# Patient Record
Sex: Male | Born: 1980 | Race: White | Hispanic: No | Marital: Single | Smoking: Current every day smoker
Health system: Southern US, Community
[De-identification: ages and names within clinical notes are randomized; demographics above are authoritative.]

## PROBLEM LIST (undated history)

## (undated) DIAGNOSIS — F32A Depression, unspecified: Secondary | ICD-10-CM

## (undated) DIAGNOSIS — F419 Anxiety disorder, unspecified: Secondary | ICD-10-CM

## (undated) DIAGNOSIS — T7840XA Allergy, unspecified, initial encounter: Secondary | ICD-10-CM

## (undated) DIAGNOSIS — F329 Major depressive disorder, single episode, unspecified: Secondary | ICD-10-CM

## (undated) HISTORY — DX: Depression, unspecified: F32.A

## (undated) HISTORY — DX: Anxiety disorder, unspecified: F41.9

## (undated) HISTORY — DX: Allergy, unspecified, initial encounter: T78.40XA

## (undated) HISTORY — DX: Major depressive disorder, single episode, unspecified: F32.9

---

## 2001-08-10 ENCOUNTER — Encounter: Admission: RE | Admit: 2001-08-10 | Discharge: 2001-08-10 | Payer: Self-pay | Admitting: Family Medicine

## 2001-10-16 ENCOUNTER — Emergency Department (HOSPITAL_COMMUNITY): Admission: EM | Admit: 2001-10-16 | Discharge: 2001-10-17 | Payer: Self-pay | Admitting: Emergency Medicine

## 2001-10-19 ENCOUNTER — Encounter: Admission: RE | Admit: 2001-10-19 | Discharge: 2001-10-19 | Payer: Self-pay | Admitting: Family Medicine

## 2001-10-26 ENCOUNTER — Encounter: Admission: RE | Admit: 2001-10-26 | Discharge: 2001-10-26 | Payer: Self-pay | Admitting: Family Medicine

## 2001-10-26 ENCOUNTER — Encounter: Admission: RE | Admit: 2001-10-26 | Discharge: 2001-10-26 | Payer: Self-pay | Admitting: Pediatrics

## 2001-10-28 ENCOUNTER — Encounter: Admission: RE | Admit: 2001-10-28 | Discharge: 2001-10-28 | Payer: Self-pay | Admitting: Family Medicine

## 2001-11-03 ENCOUNTER — Encounter: Admission: RE | Admit: 2001-11-03 | Discharge: 2001-11-03 | Payer: Self-pay | Admitting: Family Medicine

## 2002-02-28 ENCOUNTER — Encounter: Admission: RE | Admit: 2002-02-28 | Discharge: 2002-02-28 | Payer: Self-pay | Admitting: Family Medicine

## 2006-03-12 ENCOUNTER — Ambulatory Visit: Payer: Self-pay | Admitting: Family Medicine

## 2008-09-22 ENCOUNTER — Telehealth: Payer: Self-pay | Admitting: *Deleted

## 2008-09-22 ENCOUNTER — Ambulatory Visit: Payer: Self-pay | Admitting: Family Medicine

## 2008-09-22 DIAGNOSIS — M545 Low back pain: Secondary | ICD-10-CM

## 2009-09-22 ENCOUNTER — Encounter (INDEPENDENT_AMBULATORY_CARE_PROVIDER_SITE_OTHER): Payer: Self-pay | Admitting: *Deleted

## 2009-09-22 DIAGNOSIS — F17200 Nicotine dependence, unspecified, uncomplicated: Secondary | ICD-10-CM

## 2009-10-22 ENCOUNTER — Emergency Department (HOSPITAL_COMMUNITY): Admission: EM | Admit: 2009-10-22 | Discharge: 2009-10-22 | Payer: Self-pay | Admitting: Emergency Medicine

## 2010-10-12 ENCOUNTER — Emergency Department (HOSPITAL_COMMUNITY): Admission: EM | Admit: 2010-10-12 | Discharge: 2010-10-12 | Payer: Self-pay | Admitting: Emergency Medicine

## 2011-01-05 ENCOUNTER — Encounter: Payer: Self-pay | Admitting: Family Medicine

## 2011-02-02 ENCOUNTER — Encounter: Payer: Self-pay | Admitting: *Deleted

## 2011-02-26 LAB — URINALYSIS, ROUTINE W REFLEX MICROSCOPIC
Bilirubin Urine: NEGATIVE
Ketones, ur: NEGATIVE mg/dL
Nitrite: NEGATIVE
Protein, ur: NEGATIVE mg/dL
Specific Gravity, Urine: 1.024 (ref 1.005–1.030)
Urobilinogen, UA: 0.2 mg/dL (ref 0.0–1.0)

## 2011-02-26 LAB — POCT I-STAT, CHEM 8
BUN: 14 mg/dL (ref 6–23)
Chloride: 104 mEq/L (ref 96–112)
HCT: 46 % (ref 39.0–52.0)
Sodium: 139 mEq/L (ref 135–145)

## 2011-02-26 LAB — RAPID URINE DRUG SCREEN, HOSP PERFORMED
Amphetamines: NOT DETECTED
Tetrahydrocannabinol: NOT DETECTED

## 2011-04-06 IMAGING — CT CT HEAD W/O CM
1 of 2 series · 16 of 30 positions shown, 20 images · non-contrast
Comparison: None.

CLINICAL DATA: Seizure, passed out.

CT HEAD WITHOUT CONTRAST
TECHNIQUE: Contiguous axial images were obtained from the base of
the skull through the vertex without contrast.

[Series 3: recon 2: brain · axial · 0.49mm/px · z∈[+129,+269]mm · 16 of 64 slices shown, 20 images]
[im 4/64  brain]
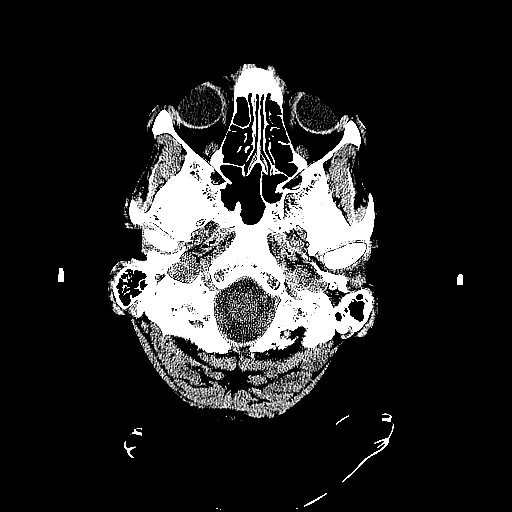
[im 4/64  bone]
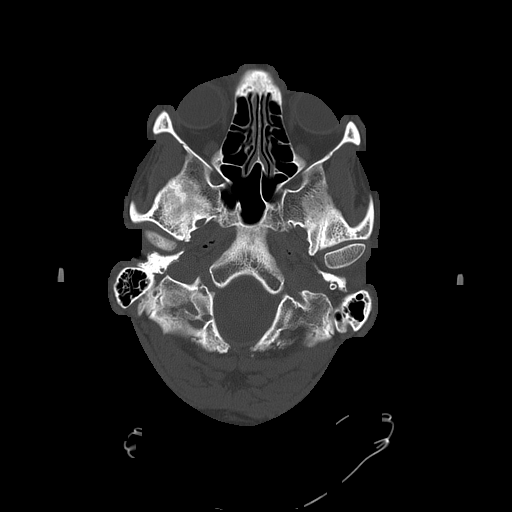
[im 7/64  brain]
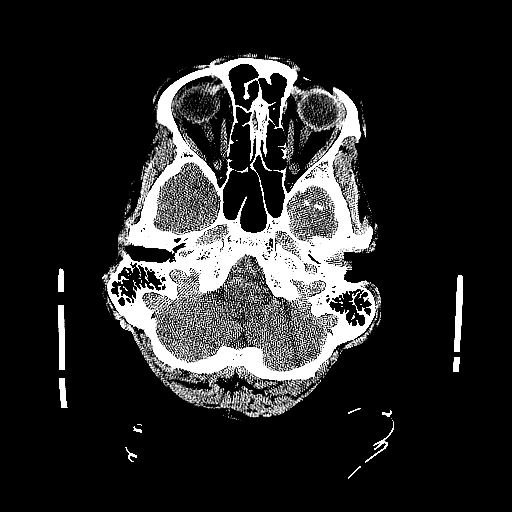
[im 10/64  brain]
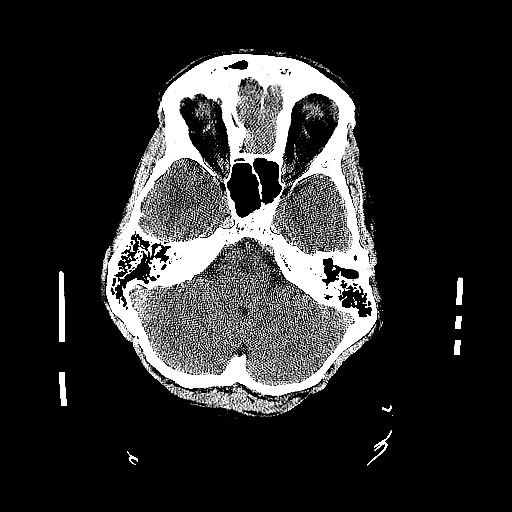
[im 14/64  brain]
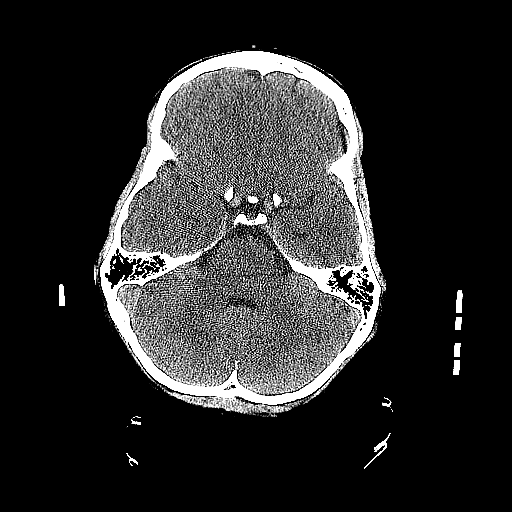
[im 20/64  brain]
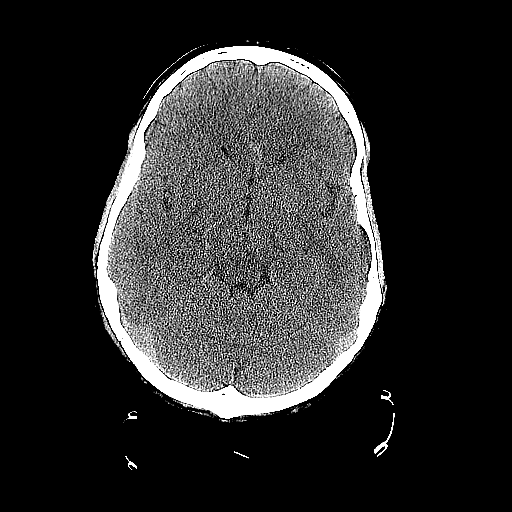
[im 20/64  bone]
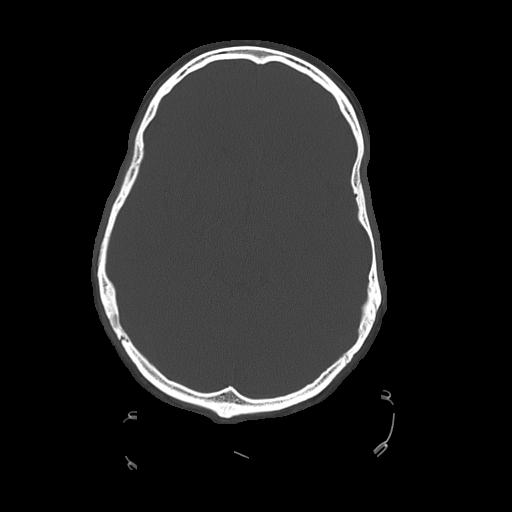
[im 24/64  brain]
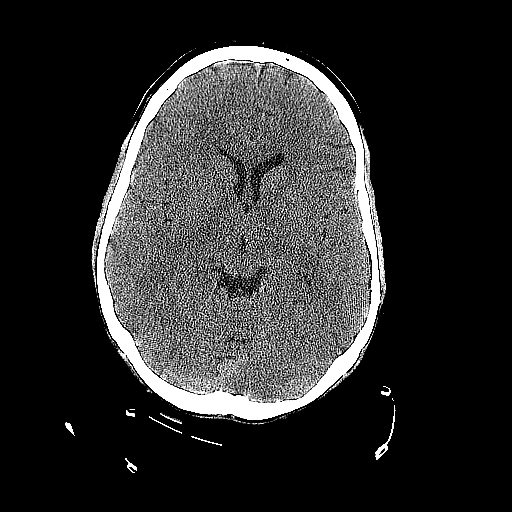
[im 27/64  brain]
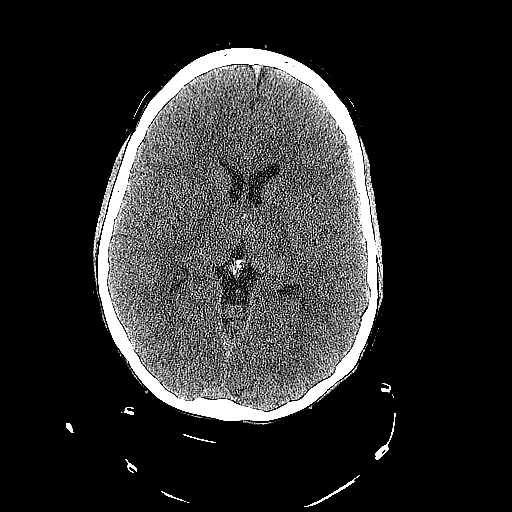
[im 30/64  brain]
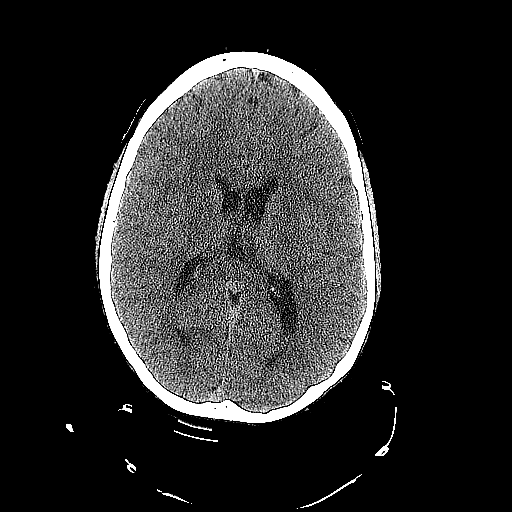
[im 34/64  brain]
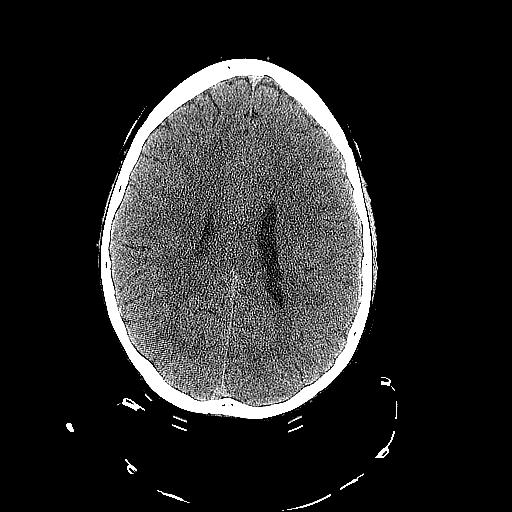
[im 34/64  bone]
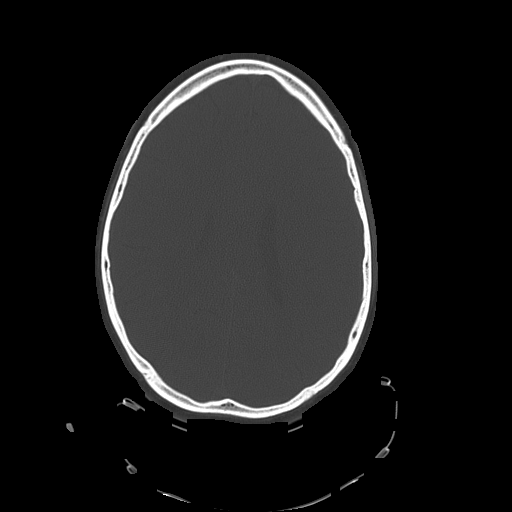
[im 37/64  brain]
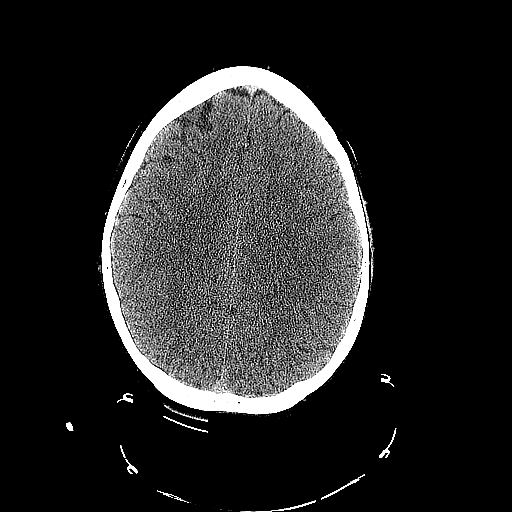
[im 40/64  brain]
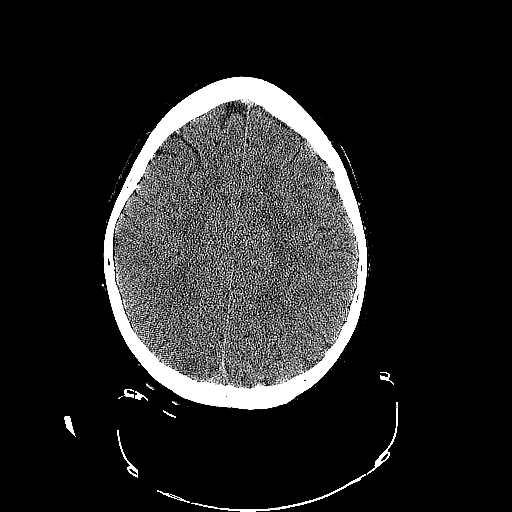
[im 44/64  brain]
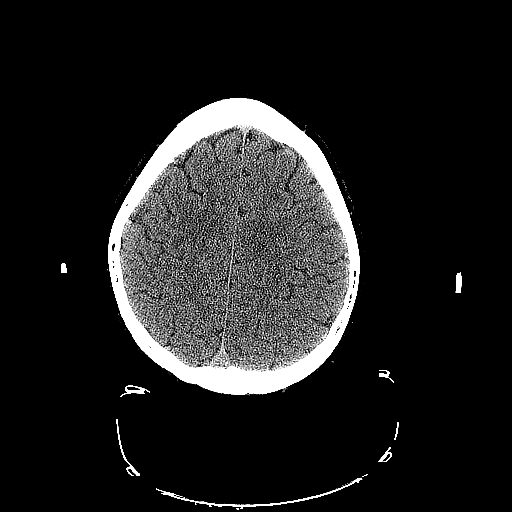
[im 50/64  brain]
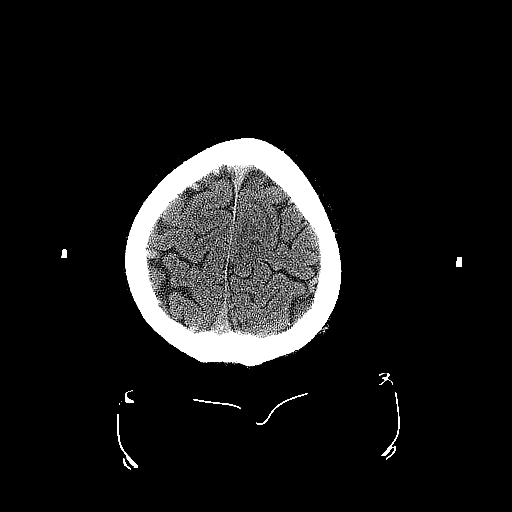
[im 50/64  bone]
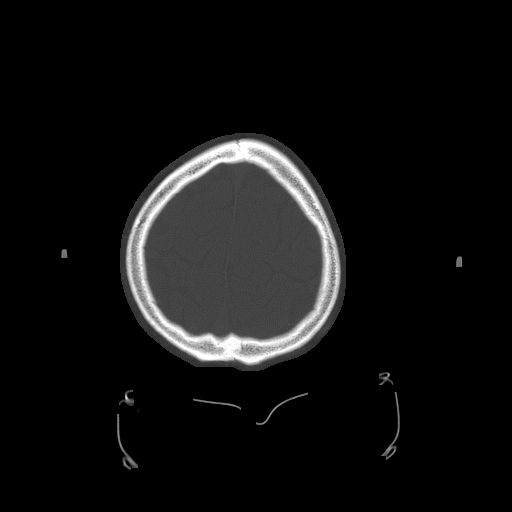
[im 54/64  brain]
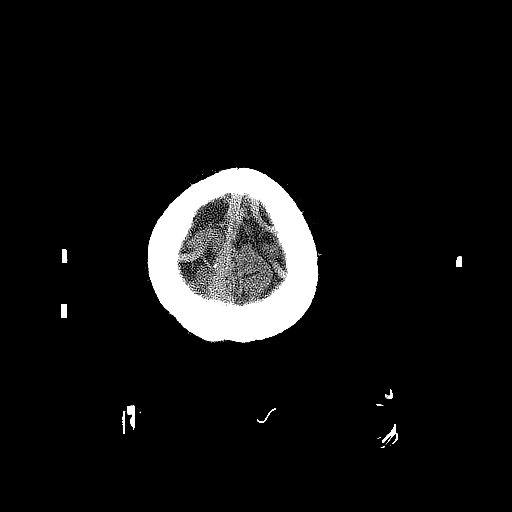
[im 57/64  brain]
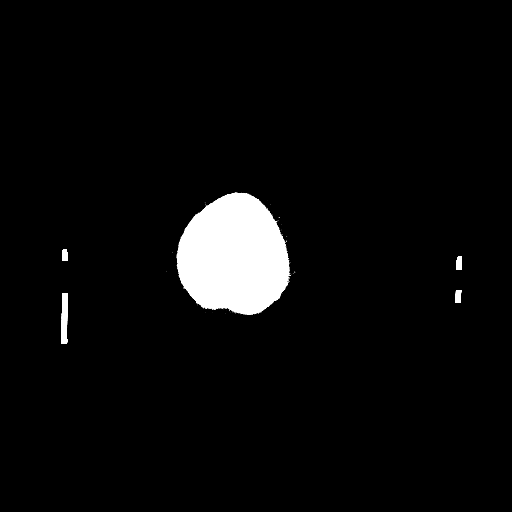
[im 60/64  brain]
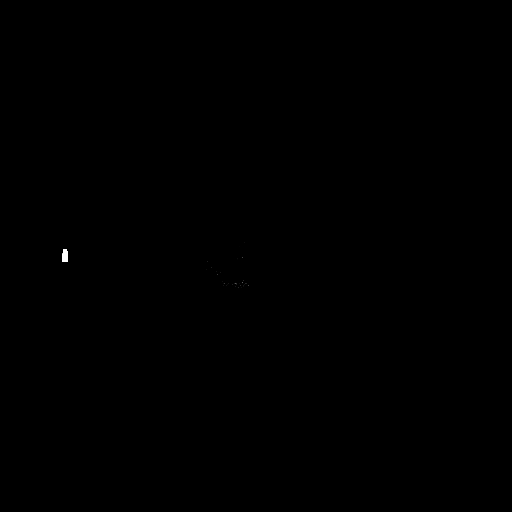

[16 of 30 positions shown; findings below may reference images not displayed]

FINDINGS: No intracranial hemorrhage.  No focal mass lesion.  No CT
evidence of acute infarction.

No midline shift or mass effect.  Hydrocephalus.  Basilar cisterns
are patent.

Paranasal sinuses and mastoid air cells are clear.  Orbits are
normal.
IMPRESSION: Normal CT head.

## 2012-03-08 ENCOUNTER — Encounter: Payer: Self-pay | Admitting: Family Medicine

## 2015-04-24 ENCOUNTER — Ambulatory Visit (INDEPENDENT_AMBULATORY_CARE_PROVIDER_SITE_OTHER): Payer: PRIVATE HEALTH INSURANCE

## 2015-04-24 ENCOUNTER — Encounter: Payer: Self-pay | Admitting: Family Medicine

## 2015-04-24 ENCOUNTER — Ambulatory Visit (INDEPENDENT_AMBULATORY_CARE_PROVIDER_SITE_OTHER): Payer: PRIVATE HEALTH INSURANCE | Admitting: Family Medicine

## 2015-04-24 VITALS — BP 106/68 | HR 70 | Temp 98.2°F | Resp 16 | Ht 66.0 in | Wt 179.0 lb

## 2015-04-24 DIAGNOSIS — Z8701 Personal history of pneumonia (recurrent): Secondary | ICD-10-CM

## 2015-04-24 DIAGNOSIS — Z720 Tobacco use: Secondary | ICD-10-CM | POA: Diagnosis not present

## 2015-04-24 DIAGNOSIS — R059 Cough, unspecified: Secondary | ICD-10-CM

## 2015-04-24 DIAGNOSIS — R071 Chest pain on breathing: Secondary | ICD-10-CM

## 2015-04-24 DIAGNOSIS — R05 Cough: Secondary | ICD-10-CM

## 2015-04-24 MED ORDER — HYDROCODONE-HOMATROPINE 5-1.5 MG/5ML PO SYRP: 5.0000 mL | ORAL_SOLUTION | Freq: Three times a day (TID) | ORAL | Status: AC | PRN

## 2015-04-24 MED ORDER — CEFUROXIME AXETIL 500 MG PO TABS: 500.0000 mg | ORAL_TABLET | Freq: Two times a day (BID) | ORAL | Status: AC

## 2015-04-24 NOTE — Patient Instructions (Signed)
Acute Bronchitis Bronchitis is when the airways that extend from the windpipe into the lungs get red, puffy, and painful (inflamed). Bronchitis often causes thick spit (mucus) to develop. This leads to a cough. A cough is the most common symptom of bronchitis. In acute bronchitis, the condition usually begins suddenly and goes away over time (usually in 2 weeks). Smoking, allergies, and asthma can make bronchitis worse. Repeated episodes of bronchitis may cause more lung problems. HOME CARE  Rest.  Drink enough fluids to keep your pee (urine) clear or pale yellow (unless you need to limit fluids as told by your doctor).  Only take over-the-counter or prescription medicines as told by your doctor.  Avoid smoking and secondhand smoke. These can make bronchitis worse. If you are a smoker, think about using nicotine gum or skin patches. Quitting smoking will help your lungs heal faster.  Reduce the chance of getting bronchitis again by:  Washing your hands often.  Avoiding people with cold symptoms.  Trying not to touch your hands to your mouth, nose, or eyes.  Follow up with your doctor as told. GET HELP IF: Your symptoms do not improve after 1 week of treatment. Symptoms include:  Cough.  Fever.  Coughing up thick spit.  Body aches.  Chest congestion.  Chills.  Shortness of breath.  Sore throat. GET HELP RIGHT AWAY IF:   You have an increased fever.  You have chills.  You have severe shortness of breath.  You have bloody thick spit (sputum).  You throw up (vomit) often.  You lose too much body fluid (dehydration).  You have a severe headache.  You faint. MAKE SURE YOU:   Understand these instructions.  Will watch your condition.  Will get help right away if you are not doing well or get worse. Document Released: 05/19/2008 Document Revised: 08/03/2013 Document Reviewed: 05/24/2013 [employer] Patient Information 2015 [employer]. This information is not  intended to replace advice given to you by your health care provider. Make sure you discuss any questions you have with your health care provider.  

## 2015-04-24 NOTE — Progress Notes (Signed)
Subjective:    Patient ID: Sean Webb, male    DOB: 03/19/1981, 34 y.o.   MRN: [id number]  HPI  This 34 y.o. Male presents with acute onset of body aches, fever, prod cough and chest discomfort w/ cough and deep breathing. He has been taking DayQuil Severe Cold and Flu capsules, last dose `6 hours ago. Pt has hx of repeated bouts of pneumonia, last time 4.5 years ago. He is a smoker (< 1ppd); no alcohol or illicit drugs. He works as a [employer] w/ exposure to dust and noxious fumes.   Review of Systems  Constitutional: Positive for fever. Negative for chills, appetite change, fatigue and unexpected weight change.  HENT: Positive for sore throat.   Respiratory: Positive for cough, chest tightness and shortness of breath. Negative for wheezing.   Cardiovascular: Positive for chest pain. Negative for palpitations.  Gastrointestinal: Negative.   Musculoskeletal: Positive for myalgias.  Skin: Negative.   Neurological: Negative.   Psychiatric/Behavioral: Negative.       Objective:   Physical Exam  Constitutional: He is oriented to person, place, and time. Vital signs are normal. He appears well-developed and well-nourished. He does not have a sickly appearance. No distress.  Blood pressure 106/68, pulse 70, temperature 98.2 F (36.8 C), temperature source Oral, resp. rate 16, height 5\' 6"  (1.676 m), weight 179 lb (81.194 kg), SpO2 94 %.   HENT:  Head: Normocephalic and atraumatic.  Right Ear: Hearing, tympanic membrane, external ear and ear canal normal.  Left Ear: Hearing, tympanic membrane, external ear and ear canal normal.  Nose: Mucosal edema present. No nasal deformity or septal deviation.  Mouth/Throat: Uvula is midline and mucous membranes are normal. No oral lesions. Normal dentition. No uvula swelling. Posterior oropharyngeal erythema present. No oropharyngeal exudate.  Eyes: Conjunctivae and EOM are normal. No scleral icterus.  Neck: Trachea normal, normal range of motion,  full passive range of motion without pain and phonation normal. Neck supple. No spinous process tenderness and no muscular tenderness present. No thyroid mass and no thyromegaly present.  Cardiovascular: Normal rate and regular rhythm.   Pulmonary/Chest: Effort normal. No accessory muscle usage. No respiratory distress. He has wheezes. He has rhonchi. He has no rales.  Musculoskeletal: Normal range of motion. He exhibits no tenderness.  Lymphadenopathy:    He has no cervical adenopathy.  Neurological: He is alert and oriented to person, place, and time. No cranial nerve deficit. Coordination normal.  Skin: Skin is warm and dry. No rash noted. He is not diaphoretic. No erythema. No pallor.  Psychiatric: He has a normal mood and affect. His behavior is normal. Judgment and thought content normal.  Nursing note and vitals reviewed.   [facility] reading (PRIMARY) by  Dr. Audria NineMcPherson: CXR- Heart size is normal. Increased peri-hilar markings. Questionable patchy infiltrate in left mid-lung zone.      Assessment & Plan:  Cough - Bronchitis- RX: cefuroxime 500 mg 1 tablet twice a day for 10 days.  Plan: DG Chest 2 View  Chest pain on breathing - Plan: DG Chest 2 View  Tobacco user- Cessation advised.  History of pneumonia   Meds ordered this encounter  Medications  . cefUROXime (CEFTIN) 500 MG tablet    Sig: Take 1 tablet (500 mg total) by mouth 2 (two) times daily with a meal.    Dispense:  20 tablet    Refill:  0  . HYDROcodone-homatropine (HYCODAN) 5-1.5 MG/5ML syrup    Sig: Take 5 mLs by mouth every  8 (eight) hours as needed for cough.    Dispense:  120 mL    Refill:  0

## 2015-10-17 IMAGING — CR DG CHEST 2V
2 series · 2 of 2 positions shown · non-contrast
Comparison: None.

CLINICAL DATA: Cough, congestion for 4-5 days, smoking history

EXAM:
CHEST  2 VIEW

[PA]
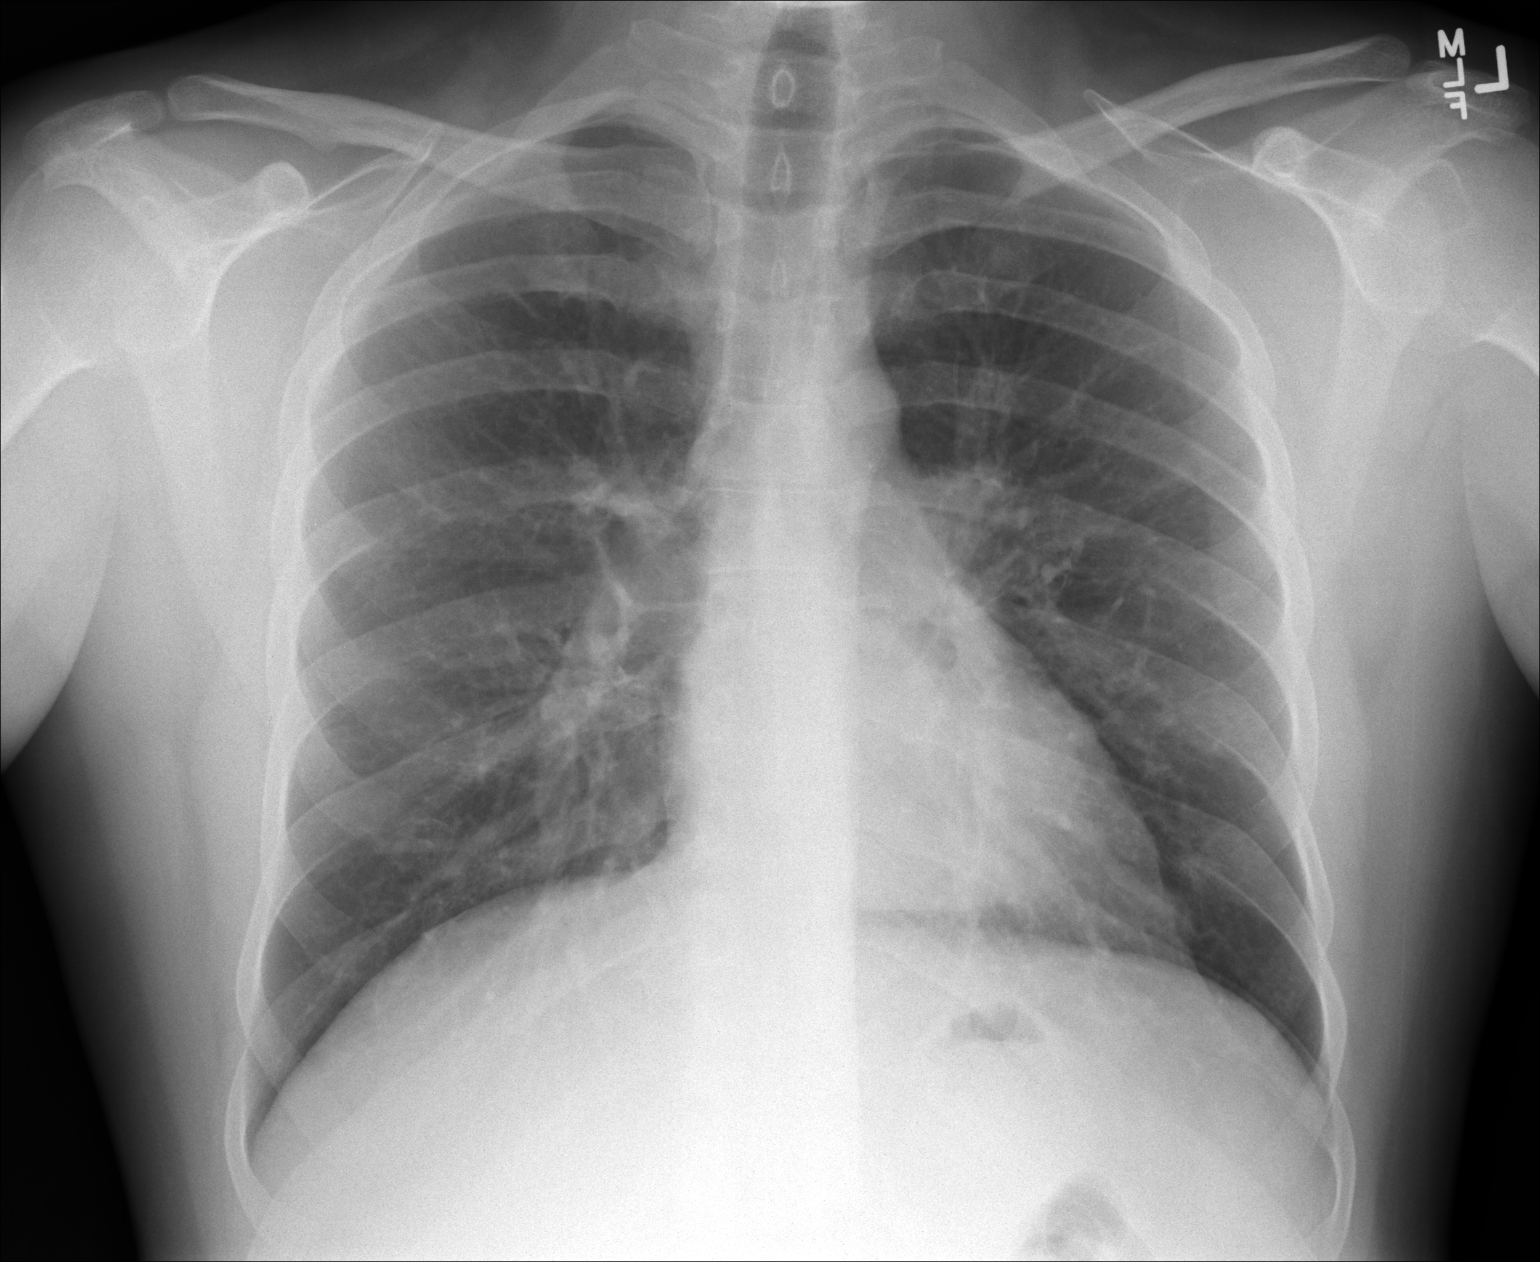

[lateral]
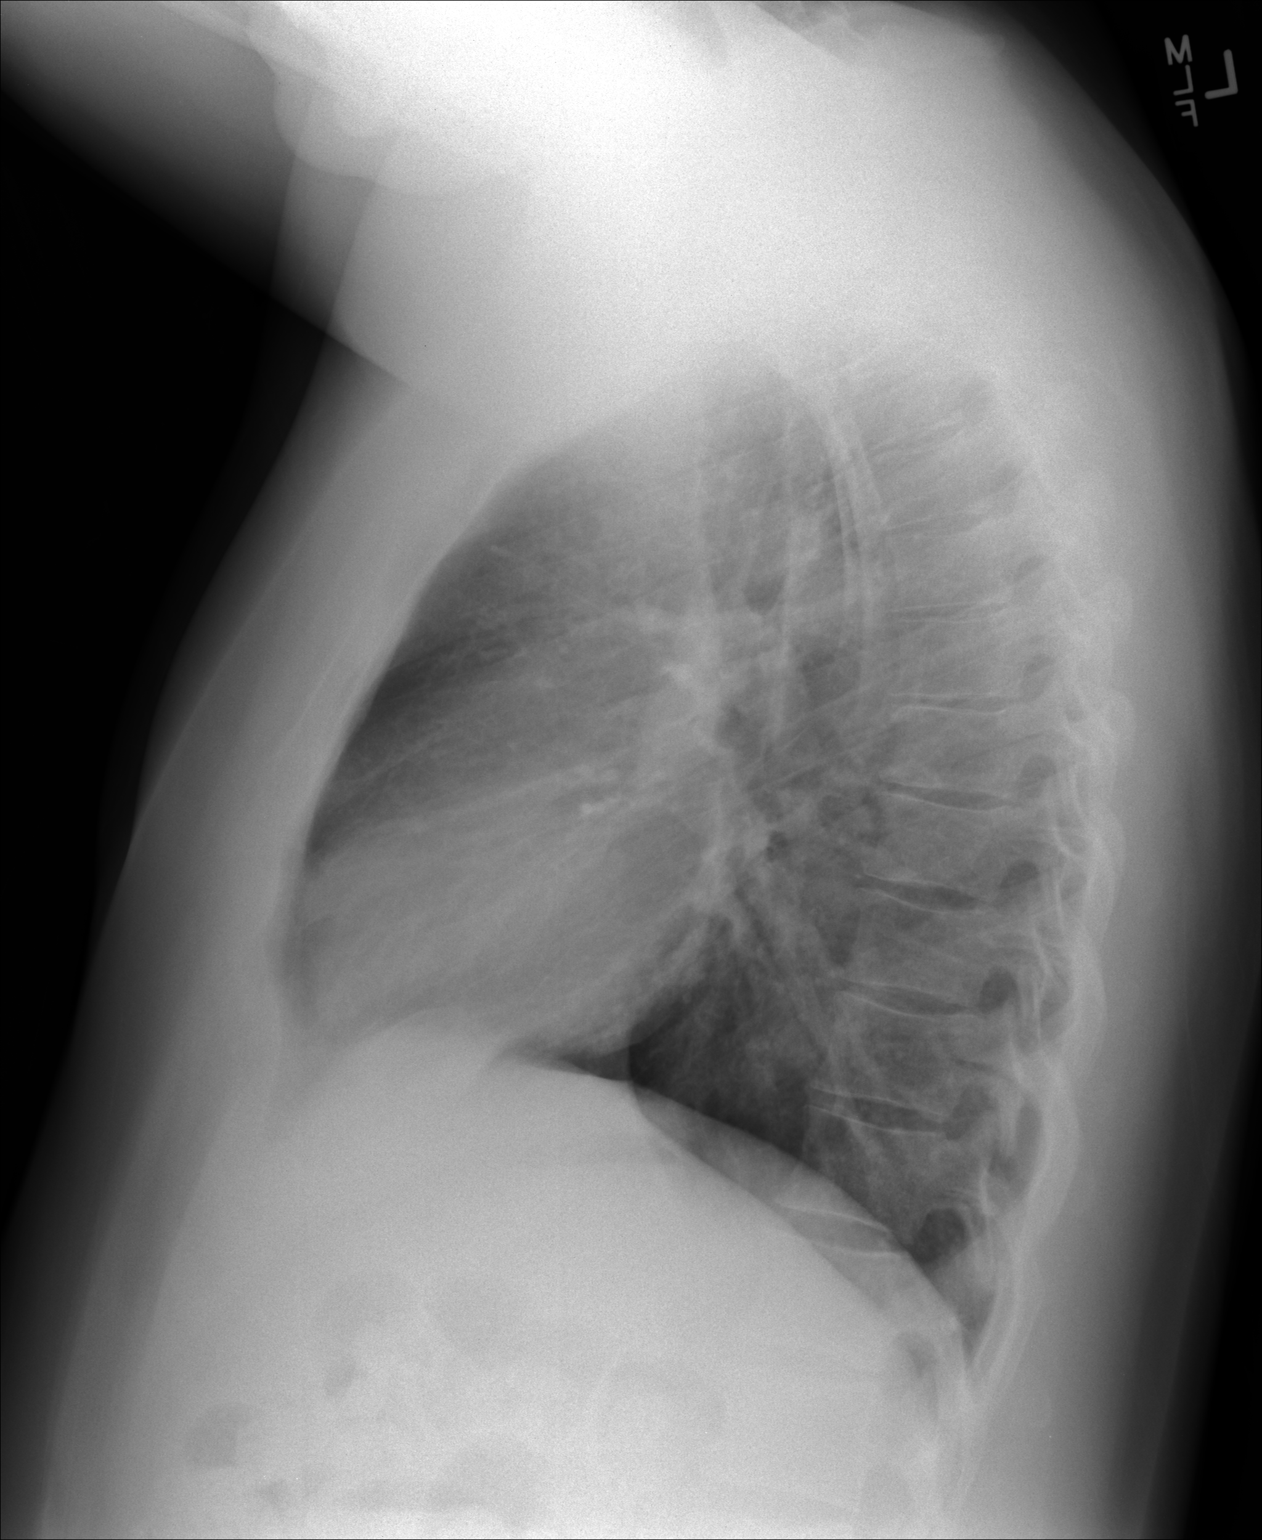

[2 of 2 positions shown; findings below may reference images not displayed]

FINDINGS: No definite pneumonia is seen. Minimally prominent perihilar
markings are present which could simply represent overlapping
vascular and bony structures but attention to this area on followup
chest x-ray is recommended. The right lung is clear. Mediastinal and
hilar contours otherwise are unremarkable. The heart is within
normal limits in size. No bony abnormality is seen.
IMPRESSION: No definite pneumonia. Minimally prominent left perihilar markings
of uncertain significance. Consider followup chest x-ray if
warranted clinically.
